# Patient Record
Sex: Male | Born: 1970 | Race: White | Hispanic: No | Marital: Single | Smoking: Never smoker
Health system: Southern US, Community
[De-identification: ages and names within clinical notes are randomized; demographics above are authoritative.]

---

## 2006-07-17 ENCOUNTER — Emergency Department (HOSPITAL_COMMUNITY): Admission: EM | Admit: 2006-07-17 | Discharge: 2006-07-18 | Payer: Self-pay | Admitting: Emergency Medicine

## 2010-10-30 LAB — URINALYSIS, ROUTINE W REFLEX MICROSCOPIC
Bilirubin Urine: NEGATIVE
Glucose, UA: NEGATIVE
Hgb urine dipstick: NEGATIVE
Specific Gravity, Urine: 1.01
Urobilinogen, UA: 0.2
pH: 6.5

## 2010-10-30 LAB — URINE MICROSCOPIC-ADD ON

## 2016-09-16 ENCOUNTER — Encounter (HOSPITAL_COMMUNITY): Payer: Self-pay | Admitting: Emergency Medicine

## 2016-09-16 ENCOUNTER — Emergency Department (HOSPITAL_COMMUNITY)
Admission: EM | Admit: 2016-09-16 | Discharge: 2016-09-16 | Disposition: A | Discharge status: Home / Self Care | Payer: Self-pay | Attending: Emergency Medicine | Admitting: Emergency Medicine

## 2016-09-16 DIAGNOSIS — L0231 Cutaneous abscess of buttock: Secondary | ICD-10-CM | POA: Insufficient documentation

## 2016-09-16 DIAGNOSIS — L0291 Cutaneous abscess, unspecified: Secondary | ICD-10-CM

## 2016-09-16 MED ORDER — OXYCODONE-ACETAMINOPHEN 5-325 MG PO TABS
1.0000 | ORAL_TABLET | Freq: Once | ORAL | Status: AC
  Administered 2016-09-16: 1 via ORAL
  Filled 2016-09-16: qty 1

## 2016-09-16 MED ORDER — ACETAMINOPHEN 325 MG PO TABS
650.0000 mg | ORAL_TABLET | Freq: Once | ORAL | Status: AC
  Administered 2016-09-16: 650 mg via ORAL
  Filled 2016-09-16: qty 2

## 2016-09-16 MED ORDER — DOXYCYCLINE HYCLATE 100 MG PO CAPS: 100.0000 mg | ORAL_CAPSULE | Freq: Two times a day (BID) | ORAL | 0 refills | Status: AC

## 2016-09-16 MED ORDER — IBUPROFEN 200 MG PO TABS
600.0000 mg | ORAL_TABLET | Freq: Once | ORAL | Status: AC
  Administered 2016-09-16: 600 mg via ORAL
  Filled 2016-09-16: qty 3

## 2016-09-16 NOTE — ED Provider Notes (Signed)
[facility]-EMERGENCY DEPT Provider Note   CSN: [id number] Arrival date & time: 09/16/16  1325     History   Chief Complaint Chief Complaint  Patient presents with  . Abscess    HPI Cody Cameron is a 46 y.o. male presents to ED for evaluation of boil to left buttock x 2 days. Girlfriend at bedside expressed some yellow drainage 1 day ago but now redness and pain has increased. Denies fevers, chills, sweats. No h/o IVDU or DM.  HPI  History reviewed. No pertinent past medical history.  There are no active problems to display for this patient.   No past surgical history on file.     Home Medications    Prior to Admission medications   Medication Sig Start Date End Date Taking? Authorizing Provider  doxycycline (VIBRAMYCIN) 100 MG capsule Take 1 capsule (100 mg total) by mouth 2 (two) times daily. 09/16/16 09/23/16  Liberty HandyGibbons, Jesaiah Fabiano J, PA-C    Family History History reviewed. No pertinent family history.  Social History Social History  Substance Use Topics  . Smoking status: Not on file  . Smokeless tobacco: Not on file  . Alcohol use Not on file     Allergies   Penicillins   Review of Systems Review of Systems  Constitutional: Negative for chills, diaphoresis and fever.  Skin: Positive for color change.     Physical Exam Updated Vital Signs BP (!) 134/104 (BP Location: Left Arm)   Pulse 60   Temp 98.1 F (36.7 C) (Oral)   Resp 16   SpO2 100%   Physical Exam  Constitutional: He is oriented to person, place, and time. He appears well-developed and well-nourished. No distress.  NAD.  HENT:  Head: Normocephalic and atraumatic.  Right Ear: External ear normal.  Left Ear: External ear normal.  Nose: Nose normal.  Eyes: Conjunctivae and EOM are normal. No scleral icterus.  Neck: Normal range of motion. Neck supple.  Cardiovascular: Normal rate, regular rhythm, normal heart sounds and intact distal pulses.   No murmur heard. Pulmonary/Chest: Effort  normal and breath sounds normal. He has no wheezes.  Musculoskeletal: Normal range of motion. He exhibits no deformity.  Neurological: He is alert and oriented to person, place, and time.  Skin: Skin is warm and dry. Capillary refill takes less than 2 seconds. There is erythema.  3 x 3 area of erythema and tenderness to lateral left buttock with localized induration in the center  Psychiatric: He has a normal mood and affect. His behavior is normal. Judgment and thought content normal.  Nursing note and vitals reviewed.    ED Treatments / Results  Labs (all labs ordered are listed, but only abnormal results are displayed) Labs Reviewed - No data to display  EKG  EKG Interpretation None       Radiology No results found.  Procedures .Marland Kitchen.Incision and Drainage Date/Time: 09/16/2016 4:59 PM Performed by: Liberty HandyGIBBONS, Isa Hitz J Authorized by: Liberty HandyGIBBONS, Kyair Ditommaso J   Consent:    Consent obtained:  Verbal   Consent given by:  Patient   Risks discussed:  Bleeding, incomplete drainage, infection and pain   Alternatives discussed:  Alternative treatment Location:    Type:  Abscess   Location:  Anogenital   Anogenital location: left buttock. Pre-procedure details:    Skin preparation:  Betadine Anesthesia (see MAR for exact dosages):    Anesthesia method:  Local infiltration   Local anesthetic:  Lidocaine 2% WITH epi Procedure type:    Complexity:  Simple  Procedure details:    Needle aspiration: no     Incision types:  Single straight   Scalpel blade:  11   Wound management:  Probed and deloculated   Drainage:  Bloody   Drainage amount:  Scant   Wound treatment:  Wound left open   Packing materials:  None Post-procedure details:    Patient tolerance of procedure:  Tolerated well, no immediate complications   (including critical care time)  Medications Ordered in ED Medications  ibuprofen (ADVIL,MOTRIN) tablet 600 mg (not administered)  oxyCODONE-acetaminophen  (PERCOCET/ROXICET) 5-325 MG per tablet 1 tablet (1 tablet Oral Given 09/16/16 1542)  acetaminophen (TYLENOL) tablet 650 mg (650 mg Oral Given 09/16/16 1542)     Initial Impression / Assessment and Plan / ED Course  I have reviewed the triage vital signs and the nursing notes.  Pertinent labs & imaging results that were available during my care of the patient were reviewed by me and considered in my medical decision making (see chart for details).     46 y.o. yo male with abscess to left buttock with surrounding cellulitis. No associated fever. I&D performed in ED with inadequate evacuation of purulent discharge.  Given surrounding cellulitis, induration and small amount of discharge expressed will d/c with doxycycline. Will discharge with warm compresses, NSAIDs and I&D care instructions. Patient aware of symptoms that would warrant return to ED for re-evaluation and treatment. Patient verbalized understanding and is agreeable with plan.    Final Clinical Impressions(s) / ED Diagnoses   Final diagnoses:  Abscess    New Prescriptions New Prescriptions   DOXYCYCLINE (VIBRAMYCIN) 100 MG CAPSULE    Take 1 capsule (100 mg total) by mouth 2 (two) times daily.     Liberty Handy, PA-C 09/16/16 1701    Lorre Nick, MD 09/19/16 [phone]

## 2016-09-16 NOTE — ED Triage Notes (Signed)
Pt states that he has had an abscess on his L buttocks x 2 days. Alert and oriented.

## 2016-09-16 NOTE — Discharge Instructions (Signed)
You were evaluated in the ED for abscess to your left buttock. This was incised and drained.  There was only a small amount of drainage expressed but you have some cellulitis around the abscess.   Use heating pad or warm compress to facilitate drainage. Take tylenol 1000 mg + ibuprofen 600 mg for inflammation and pain every 8 hours. Take doxycycline (antibiotic) as prescribed and until completed.   Monitor for signs of recollection of positive including increased swelling, redness, warmth, pain or fevers.

## 2017-01-09 ENCOUNTER — Emergency Department (HOSPITAL_COMMUNITY): Admission: EM | Admit: 2017-01-09 | Discharge: 2017-01-09 | Discharge status: Against Medical Advice | Payer: Self-pay

## 2019-08-29 ENCOUNTER — Emergency Department (HOSPITAL_COMMUNITY)

## 2019-08-29 ENCOUNTER — Encounter (HOSPITAL_COMMUNITY): Payer: Self-pay | Admitting: *Deleted

## 2019-08-29 ENCOUNTER — Emergency Department (HOSPITAL_COMMUNITY)
Admission: EM | Admit: 2019-08-29 | Discharge: 2019-08-29 | Disposition: A | Discharge status: Home / Self Care | Attending: Emergency Medicine | Admitting: Emergency Medicine

## 2019-08-29 ENCOUNTER — Other Ambulatory Visit: Payer: Self-pay

## 2019-08-29 DIAGNOSIS — M25562 Pain in left knee: Secondary | ICD-10-CM | POA: Insufficient documentation

## 2019-08-29 DIAGNOSIS — S01311A Laceration without foreign body of right ear, initial encounter: Secondary | ICD-10-CM | POA: Diagnosis not present

## 2019-08-29 DIAGNOSIS — M25561 Pain in right knee: Secondary | ICD-10-CM

## 2019-08-29 DIAGNOSIS — S60221A Contusion of right hand, initial encounter: Secondary | ICD-10-CM | POA: Diagnosis not present

## 2019-08-29 DIAGNOSIS — Y9389 Activity, other specified: Secondary | ICD-10-CM | POA: Diagnosis not present

## 2019-08-29 DIAGNOSIS — Y999 Unspecified external cause status: Secondary | ICD-10-CM | POA: Insufficient documentation

## 2019-08-29 DIAGNOSIS — Z23 Encounter for immunization: Secondary | ICD-10-CM | POA: Insufficient documentation

## 2019-08-29 DIAGNOSIS — Y9289 Other specified places as the place of occurrence of the external cause: Secondary | ICD-10-CM | POA: Insufficient documentation

## 2019-08-29 DIAGNOSIS — S0990XA Unspecified injury of head, initial encounter: Secondary | ICD-10-CM | POA: Diagnosis present

## 2019-08-29 MED ORDER — LIDOCAINE-EPINEPHRINE-TETRACAINE (LET) TOPICAL GEL
6.0000 mL | Freq: Once | TOPICAL | Status: AC
  Administered 2019-08-29: 6 mL via TOPICAL
  Filled 2019-08-29: qty 6

## 2019-08-29 MED ORDER — TETANUS-DIPHTH-ACELL PERTUSSIS 5-2.5-18.5 LF-MCG/0.5 IM SUSP
0.5000 mL | Freq: Once | INTRAMUSCULAR | Status: AC
  Administered 2019-08-29: 0.5 mL via INTRAMUSCULAR
  Filled 2019-08-29: qty 0.5
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
## 2019-08-29 NOTE — Discharge Instructions (Signed)
The laceration to your right ear was repaired with absorbable sutures that will dissolve on their own.  You can shower, and wash this with gentle soap and water, do not scrub vigorously.  You have some bruising and swelling on the ear that will be sore, you can apply ice and take ibuprofen or Tylenol.  Your imaging was reassuring, I suspect you have a hematoma of your hand, use Ace wrap to provide compression, you can elevate this and apply ice as well.  Your knee x-rays did not show any signs of fracture if pain is not improving please follow-up with orthopedics.  If you develop redness, swelling or increasing pain to the ear these are signs of infection, and you should return for reevaluation.
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
## 2022-10-18 ENCOUNTER — Encounter (HOSPITAL_BASED_OUTPATIENT_CLINIC_OR_DEPARTMENT_OTHER): Payer: Self-pay

## 2022-10-18 ENCOUNTER — Emergency Department (HOSPITAL_BASED_OUTPATIENT_CLINIC_OR_DEPARTMENT_OTHER): Payer: BLUE CROSS/BLUE SHIELD

## 2022-10-18 ENCOUNTER — Emergency Department (HOSPITAL_BASED_OUTPATIENT_CLINIC_OR_DEPARTMENT_OTHER)
Admission: EM | Admit: 2022-10-18 | Discharge: 2022-10-19 | Disposition: A | Discharge status: Home / Self Care | Payer: BLUE CROSS/BLUE SHIELD | Attending: Emergency Medicine | Admitting: Emergency Medicine

## 2022-10-18 DIAGNOSIS — R030 Elevated blood-pressure reading, without diagnosis of hypertension: Secondary | ICD-10-CM | POA: Diagnosis not present

## 2022-10-18 DIAGNOSIS — K5792 Diverticulitis of intestine, part unspecified, without perforation or abscess without bleeding: Secondary | ICD-10-CM | POA: Insufficient documentation

## 2022-10-18 DIAGNOSIS — K4020 Bilateral inguinal hernia, without obstruction or gangrene, not specified as recurrent: Secondary | ICD-10-CM | POA: Insufficient documentation

## 2022-10-18 DIAGNOSIS — R1032 Left lower quadrant pain: Secondary | ICD-10-CM | POA: Diagnosis present

## 2022-10-18 LAB — CBC WITH DIFFERENTIAL/PLATELET
Abs Immature Granulocytes: 0.01 10*3/uL (ref 0.00–0.07)
Basophils Absolute: 0 10*3/uL (ref 0.0–0.1)
Basophils Relative: 1 %
Eosinophils Absolute: 0.1 10*3/uL (ref 0.0–0.5)
Eosinophils Relative: 2 %
HCT: 42.8 % (ref 39.0–52.0)
Hemoglobin: 15 g/dL (ref 13.0–17.0)
Immature Granulocytes: 0 %
Lymphocytes Relative: 29 %
Lymphs Abs: 2.2 10*3/uL (ref 0.7–4.0)
MCH: 31.4 pg (ref 26.0–34.0)
MCHC: 35 g/dL (ref 30.0–36.0)
MCV: 89.5 fL (ref 80.0–100.0)
Monocytes Absolute: 0.5 10*3/uL (ref 0.1–1.0)
Monocytes Relative: 6 %
Neutro Abs: 4.7 10*3/uL (ref 1.7–7.7)
Neutrophils Relative %: 62 %
Platelets: 301 10*3/uL (ref 150–400)
RBC: 4.78 MIL/uL (ref 4.22–5.81)
RDW: 12.5 % (ref 11.5–15.5)
WBC: 7.5 10*3/uL (ref 4.0–10.5)
nRBC: 0 % (ref 0.0–0.2)

## 2022-10-18 LAB — BASIC METABOLIC PANEL
Anion gap: 4 — ABNORMAL LOW (ref 5–15)
BUN: 19 mg/dL (ref 6–20)
CO2: 32 mmol/L (ref 22–32)
Calcium: 9 mg/dL (ref 8.9–10.3)
Chloride: 103 mmol/L (ref 98–111)
Creatinine, Ser: 0.98 mg/dL (ref 0.61–1.24)
GFR, Estimated: >60 mL/min (ref >60)
Glucose, Bld: 88 mg/dL (ref 70–99)
Potassium: 3.9 mmol/L (ref 3.5–5.1)
Sodium: 139 mmol/L (ref 135–145)

## 2022-10-18 MED ORDER — IOHEXOL 300 MG/ML  SOLN
100.0000 mL | Freq: Once | INTRAMUSCULAR | Status: AC | PRN
  Administered 2022-10-18: 100 mL via INTRAVENOUS

## 2022-10-18 NOTE — ED Triage Notes (Signed)
Pt states "I'm pretty positive I've got a hernia, based on how it feels." Endorses "puffed out area" in L groin area, associated "burning feeling." Denies NV, additional symptoms

## 2022-10-18 NOTE — ED Provider Notes (Signed)
Cody Cameron EMERGENCY DEPARTMENT AT [facility] Provider Note   CSN: [id number] Arrival date & time: 10/18/22  1928     History  No chief complaint on file.   Cody Cameron is a 52 y.o. male who presents emergency department chief complaint of left groin pain and bulging.  Patient first noticed the symptoms about 3 to 4 weeks ago.  Since that time he said intermittent pain.  Today the patient was doing some work when he noticed sharp pain.  He states that during the day the bulge in his left groin gets bigger and at night it seems to get smaller.  He states that sometimes he can feel something "squishing through the area".  It has become more painful and is at times 8 out of 10 pain.  Currently about 5 out of 10.  Worse when it is bulging.  He has no history of nausea, vomiting, bowel obstruction, heat, redness or change in color.  HPI     Home Medications Prior to Admission medications   Not on File      Allergies    Penicillins    Review of Systems   Review of Systems  Physical Exam Updated Vital Signs BP 138/85   Pulse 63   Temp 98.4 F (36.9 C)   Resp 16   SpO2 100%  Physical Exam Vitals and nursing note reviewed.  Constitutional:      General: He is not in acute distress.    Appearance: He is well-developed. He is not diaphoretic.  HENT:     Head: Normocephalic and atraumatic.  Eyes:     General: No scleral icterus.    Conjunctiva/sclera: Conjunctivae normal.  Cardiovascular:     Rate and Rhythm: Normal rate and regular rhythm.     Heart sounds: Normal heart sounds.  Pulmonary:     Effort: Pulmonary effort is normal. No respiratory distress.     Breath sounds: Normal breath sounds.  Abdominal:     Palpations: Abdomen is soft.     Tenderness: There is no abdominal tenderness.     Hernia: A hernia is present. Hernia is present in the left inguinal area.     Comments: Left inguinal hernia noted.  It is not visible when patient is lying flat  however becomes larger and bulges slightly with standing. no significant tenderness  noted  Musculoskeletal:     Cervical back: Normal range of motion and neck supple.  Skin:    General: Skin is warm and dry.  Neurological:     Mental Status: He is alert.  Psychiatric:        Behavior: Behavior normal.     ED Results / Procedures / Treatments   Labs (all labs ordered are listed, but only abnormal results are displayed) Labs Reviewed - No data to display  EKG None  Radiology No results found.  Procedures Procedures    Medications Ordered in ED Medications - No data to display  ED Course/ Medical Decision Making/ A&P                                 Medical Decision Making 52 year old male who presents emergency department chief complaint of left-sided hernia pain.  Differential diagnosis for swelling of the groin includes lymphadenopathy, hematoma, abscess and hernia.  On exam patient has no obvious hernia when lying down however begins to have some bulging in the left inguinal when  he stands up.  I ordered labs which shows CBC without elevated white blood cell count, BMP without significant abnormality.   I visualized and interpreted CT abdomen pelvis which shows bilateral fat-containing hernias in the inguinal region and inflammation of the colon abutting the left inguinal hernia.  I suspect that this inflammatory process is more likely due to spontaneous movement and reduction of the intestine in and out of this inguinal hernia.  Currently patient has no significant pain.  Will treat with Cipro and Flagyl.  He is unable to take penicillins due to history of blistering rash with amoxicillin.  Patient comfortable with the plan.  Advised of inguinal truss which his wife is currently ordering from [facility] and close outpatient follow-up with surgery.  I discussed strict return precautions with the patient.  Advised not to drink alcohol while taking Flagyl.  Amount and/or  Complexity of Data Reviewed Labs: ordered. Radiology: ordered.  Risk Prescription drug management.           Final Clinical Impression(s) / ED Diagnoses Final diagnoses:  Bilateral inguinal hernia without obstruction or gangrene, recurrence not specified  Elevated blood pressure reading  Diverticulitis    Rx / DC Orders ED Discharge Orders     None         Arthor Captain, PA-C 10/19/22 0056    Royanne Foots, DO 10/29/22 [phone]

## 2022-10-19 MED ORDER — METRONIDAZOLE 500 MG PO TABS
500.0000 mg | ORAL_TABLET | Freq: Once | ORAL | Status: AC
  Administered 2022-10-19: 500 mg via ORAL
  Filled 2022-10-19: qty 1

## 2022-10-19 MED ORDER — CIPROFLOXACIN HCL 500 MG PO TABS: 500.0000 mg | ORAL_TABLET | Freq: Two times a day (BID) | ORAL | 0 refills | Status: DC

## 2022-10-19 MED ORDER — METRONIDAZOLE 500 MG PO TABS: 500.0000 mg | ORAL_TABLET | Freq: Three times a day (TID) | ORAL | 0 refills | Status: DC

## 2022-10-19 MED ORDER — CIPROFLOXACIN HCL 500 MG PO TABS
500.0000 mg | ORAL_TABLET | Freq: Once | ORAL | Status: AC
  Administered 2022-10-19: 500 mg via ORAL
  Filled 2022-10-19: qty 1

## 2022-10-19 NOTE — Discharge Instructions (Addendum)
Get help right away if:You have pain in your groin that suddenly gets worse.You have a bulge in your groin that:Suddenly gets bigger and does not get smaller.Becomes red or purple or painful to the touch.You are a man and you have a sudden pain in your scrotum, or the size of your scrotum suddenly changes.You cannot push the hernia back in place by very gently pressing on it when you are lying down.You have nausea or vomiting that does not go away.You have a fast heartbeat.You cannot have a bowel movement or pass gas.These symptoms may represent a serious problem that is an emergency. Do not wait to see if the symptoms will go away. Get medical help right away. Call your local emergency services (911 in the [place]

## 2022-10-20 ENCOUNTER — Telehealth (HOSPITAL_BASED_OUTPATIENT_CLINIC_OR_DEPARTMENT_OTHER): Payer: Self-pay | Admitting: Emergency Medicine

## 2022-10-20 MED ORDER — CIPROFLOXACIN HCL 500 MG PO TABS: 500.0000 mg | ORAL_TABLET | Freq: Two times a day (BID) | ORAL | 0 refills | Status: AC

## 2022-10-20 MED ORDER — METRONIDAZOLE 500 MG PO TABS: 500.0000 mg | ORAL_TABLET | Freq: Three times a day (TID) | ORAL | 0 refills | Status: AC

## 2022-10-20 NOTE — Telephone Encounter (Signed)
Prescriptions were not sent to pharmacy.  Resent pharmacies to [facility] on [address].
